# Patient Record
Sex: Male | Born: 1960 | Hispanic: Yes | Marital: Married | Smoking: Current every day smoker
Health system: Southern US, Community
[De-identification: ages and names within clinical notes are randomized; demographics above are authoritative.]

## PROBLEM LIST (undated history)

## (undated) HISTORY — PX: BACK SURGERY: SHX140

---

## 2014-08-06 ENCOUNTER — Emergency Department: Payer: Worker's Compensation

## 2014-08-06 ENCOUNTER — Emergency Department
Admission: EM | Admit: 2014-08-06 | Discharge: 2014-08-06 | Disposition: A | Discharge status: Home / Self Care | Payer: Worker's Compensation | Attending: Student | Admitting: Student

## 2014-08-06 DIAGNOSIS — Z720 Tobacco use: Secondary | ICD-10-CM | POA: Insufficient documentation

## 2014-08-06 DIAGNOSIS — S66812A Strain of other specified muscles, fascia and tendons at wrist and hand level, left hand, initial encounter: Secondary | ICD-10-CM | POA: Insufficient documentation

## 2014-08-06 DIAGNOSIS — S63522A Sprain of radiocarpal joint of left wrist, initial encounter: Secondary | ICD-10-CM | POA: Insufficient documentation

## 2014-08-06 DIAGNOSIS — Y9289 Other specified places as the place of occurrence of the external cause: Secondary | ICD-10-CM | POA: Insufficient documentation

## 2014-08-06 DIAGNOSIS — Y998 Other external cause status: Secondary | ICD-10-CM | POA: Insufficient documentation

## 2014-08-06 DIAGNOSIS — Y9389 Activity, other specified: Secondary | ICD-10-CM | POA: Insufficient documentation

## 2014-08-06 DIAGNOSIS — W208XXA Other cause of strike by thrown, projected or falling object, initial encounter: Secondary | ICD-10-CM | POA: Insufficient documentation

## 2014-08-06 MED ORDER — NAPROXEN 500 MG PO TABS: 500.0000 mg | ORAL_TABLET | Freq: Two times a day (BID) | ORAL | Status: AC

## 2014-08-06 NOTE — ED Provider Notes (Signed)
[facility] Emergency Department Provider Note  ____________________________________________  Time seen: Approximately 11:24 AM  I have reviewed the triage vital signs and the nursing notes.   HISTORY  Chief Complaint Wrist Pain   Spanish interpreter HPI Randy Brown is a 54 y.o. male patient complaining of 2 months of left wrist pain secondary to a sprain. She was seen in urgent care clinic and negative x-rays. This is a worker's comp injury which has been denied for further care. Patient is requesting a CT scan of his wrist. Patient also requesting pain medication for this complaint. Patient say he was taking tramadol prescribed for urgent care wouldn't call some itching so he stopped taking it.Patient rated his pain 7/10 described as dull and aching. Patient is continue to wear a wrist support since date of injury. Patient states his lawyer symptom to the ER.   History reviewed. No pertinent past medical history.  There are no active problems to display for this patient.   Past Surgical History  Procedure Laterality Date  . Back surgery      Current Outpatient Rx  Name  Route  Sig  Dispense  Refill  . naproxen (NAPROSYN) 500 MG tablet   Oral   Take 1 tablet (500 mg total) by mouth 2 (two) times daily with a meal.   20 tablet   0     Allergies Tramadol  No family history on file.  Social History History  Substance Use Topics  . Smoking status: Current Every Day Smoker  . Smokeless tobacco: Never Used  . Alcohol Use: No    Review of Systems Constitutional: No fever/chills Eyes: No visual changes. ENT: No sore throat. Cardiovascular: Denies chest pain. Respiratory: Denies shortness of breath. Gastrointestinal: No abdominal pain.  No nausea, no vomiting.  No diarrhea.  No constipation. Genitourinary: Negative for dysuria. Musculoskeletal: Positive for left wrist pain. Skin: Negative for rash. Neurological: Negative for  headaches, focal weakness or numbness. 10-point ROS otherwise negative.  ____________________________________________   PHYSICAL EXAM:  VITAL SIGNS: ED Triage Vitals  Enc Vitals Group     BP --      Pulse --      Resp --      Temp --      Temp src --      SpO2 --      Weight --      Height --      Head Cir --      Peak Flow --      Pain Score --      Pain Loc --      Pain Edu? --      Excl. in GC? --     Constitutional: Alert and oriented. Well appearing and in no acute distress. Eyes: Conjunctivae are normal. PERRL. EOMI. Head: Atraumatic. Nose: No congestion/rhinnorhea. Mouth/Throat: Mucous membranes are moist.  Oropharynx non-erythematous. Neck: No stridor.  No cervical spine tenderness to palpation. Hematological/Lymphatic/Immunilogical: No cervical lymphadenopathy. Cardiovascular: Normal rate, regular rhythm. Grossly normal heart sounds.  Good peripheral circulation. Respiratory: Normal respiratory effort.  No retractions. Lungs CTAB. Gastrointestinal: Soft and nontender. No distention. No abdominal bruits. No CVA tenderness. Musculoskeletal: Left wrist shows no deformity edema or ecchymosis. Mild guarding with palpation of the distal radius. Neurovascular intact.  Neurologic:  Normal speech and language. No gross focal neurologic deficits are appreciated. No gait instability. Skin:  Skin is warm, dry and intact. No rash noted. Psychiatric: Mood and affect are normal. Speech and behavior  are normal.  ____________________________________________   LABS (all labs ordered are listed, but only abnormal results are displayed)  Labs Reviewed - No data to display ____________________________________________  EKG   ____________________________________________  RADIOLOGY  No acute findings there is a widened joint space tenderness And 11 a suspicion for ligamentous injury. I, Joni Reining, personally viewed and evaluated these images as part of my medical  decision making.   ____________________________________________   PROCEDURES  Procedure(s) performed: None  Critical Care performed: No  ____________________________________________   INITIAL IMPRESSION / ASSESSMENT AND PLAN / ED COURSE  Pertinent labs & imaging results that were available during my care of the patient were reviewed by me and considered in my medical decision making (see chart for details).  Sprain left wrist. X-ray findings were discussed with patient. Advised follow orthopedics for definitive evaluation  and treatment. ____Patient advised to continue wearing a splint to evaluated by orthopedics. Patient can a prescription for naproxen. ________________________________________   FINAL CLINICAL IMPRESSION(S) / ED DIAGNOSES  Final diagnoses:  Sprain and strain of radiocarpal (joint) (ligament) of wrist, left, initial encounter       Joni Reining, PA-C 08/06/14 1259  Gayla Doss, MD 08/06/14 [phone]

## 2014-08-06 NOTE — Discharge Instructions (Signed)
Esguince de ligamento °(Ligament Sprain) °Un esguince de ligamento se produce cuando se estiran las bandas de tejido que mantienen unidos a los huesos (ligamento). °CUIDADOS EN EL HOGAR  °· Mantenga la zona afectada en reposo. °· Comience a usar la articulación cuando su médico se lo indique. °· Mantenga el área lesionada levantada (elevada) por encima del nivel del corazón. Esto podría aliviar la hinchazón (inflamación). °· Aplique hielo sobre la zona lesionada. °¨ Ponga el hielo en una bolsa plástica. °¨ Colóquese una toalla entre la piel y la bolsa de hielo. °¨ Deje el hielo durante 15 a 20 minutos, 3 a 4 veces por día. °· Use una férula, un yeso o una venda elástica según las indicaciones de su médico. °· Solo tome los medicamentos que le haya indicado su médico. °· Utilice las muletas como le haya indicado el médico. No cargue peso sobre la articulación hasta que el médico lo autorice. °SOLICITE AYUDA DE INMEDIATO SI:  °· Tiene más moretones, hinchazón o dolor. °· La lesión es en la pierna y tiene los dedos de los pies fríos, azules, con hormigueo o adormecidos. °· La lesión es en el brazo y tiene los dedos de las manos fríos, azules, con hormigueo o adormecidos. °· Siente un dolor intenso que no se alivia los medicamentos. °· El dolor empeora. °ASEGÚRESE DE QUE:  °· Comprende estas instrucciones. °· Controlará la enfermedad. °· Recibirá ayuda de inmediato si no mejora o si empeora. °Document Released: 10/12/2012 °ExitCare® Patient Information ©2015 [employer]. This information is not intended to replace advice given to you by your health care provider. Make sure you discuss any questions you have with your health care provider. ° °

## 2014-08-06 NOTE — ED Notes (Signed)
Iron bar fell on his left hand between thumb and index finger in May. Still having pain.

## 2014-08-06 NOTE — ED Notes (Signed)
Interpreter here with PA to speak with patient about results and further treatment with ortho.

## 2014-09-03 ENCOUNTER — Encounter: Payer: Self-pay | Admitting: Emergency Medicine

## 2014-09-03 ENCOUNTER — Emergency Department: Payer: Self-pay

## 2014-09-03 ENCOUNTER — Emergency Department
Admission: EM | Admit: 2014-09-03 | Discharge: 2014-09-03 | Disposition: A | Discharge status: Home / Self Care | Payer: Self-pay | Attending: Emergency Medicine | Admitting: Emergency Medicine

## 2014-09-03 DIAGNOSIS — Z791 Long term (current) use of non-steroidal anti-inflammatories (NSAID): Secondary | ICD-10-CM | POA: Insufficient documentation

## 2014-09-03 DIAGNOSIS — T1490XA Injury, unspecified, initial encounter: Secondary | ICD-10-CM

## 2014-09-03 DIAGNOSIS — S6992XA Unspecified injury of left wrist, hand and finger(s), initial encounter: Secondary | ICD-10-CM | POA: Insufficient documentation

## 2014-09-03 DIAGNOSIS — Z720 Tobacco use: Secondary | ICD-10-CM | POA: Insufficient documentation

## 2014-09-03 DIAGNOSIS — Y9289 Other specified places as the place of occurrence of the external cause: Secondary | ICD-10-CM | POA: Insufficient documentation

## 2014-09-03 DIAGNOSIS — S63512A Sprain of carpal joint of left wrist, initial encounter: Secondary | ICD-10-CM | POA: Insufficient documentation

## 2014-09-03 DIAGNOSIS — Y9389 Activity, other specified: Secondary | ICD-10-CM | POA: Insufficient documentation

## 2014-09-03 DIAGNOSIS — M25332 Other instability, left wrist: Secondary | ICD-10-CM

## 2014-09-03 DIAGNOSIS — X58XXXA Exposure to other specified factors, initial encounter: Secondary | ICD-10-CM | POA: Insufficient documentation

## 2014-09-03 DIAGNOSIS — Y990 Civilian activity done for income or pay: Secondary | ICD-10-CM | POA: Insufficient documentation

## 2014-09-03 MED ORDER — INDOMETHACIN 50 MG PO CAPS: 50.0000 mg | ORAL_CAPSULE | Freq: Two times a day (BID) | ORAL | Status: AC

## 2014-09-03 MED ORDER — KETOROLAC TROMETHAMINE 60 MG/2ML IM SOLN
60.0000 mg | Freq: Once | INTRAMUSCULAR | Status: AC
  Administered 2014-09-03: 60 mg via INTRAMUSCULAR
  Filled 2014-09-03: qty 2

## 2014-09-03 NOTE — ED Provider Notes (Signed)
[facility] Emergency Department Provider Note  ____________________________________________  Time seen: Approximately 11:43 AM  I have reviewed the triage vital signs and the nursing notes. History and physical exam obtained via interpreter [place] through interpreter services at [facility].   HISTORY  Chief Complaint Arm Pain    HPI Randy Brown is a 54 y.o. male patient states that he was injured on May 20 at work has been seen several times with x-rays been negative. Complains of continuous left wrist pain and unable to work. Positive tingling down his fingers.Patient was diagnosed with a sprain has been in urgent care clinic negative x-rays and a negative x-rays is here as well. He continues to wear a support brace since that time.   History reviewed. No pertinent past medical history.  There are no active problems to display for this patient.   Past Surgical History  Procedure Laterality Date  . Back surgery      Current Outpatient Rx  Name  Route  Sig  Dispense  Refill  . indomethacin (INDOCIN) 50 MG capsule   Oral   Take 1 capsule (50 mg total) by mouth 2 (two) times daily with a meal.   30 capsule   0   . naproxen (NAPROSYN) 500 MG tablet   Oral   Take 1 tablet (500 mg total) by mouth 2 (two) times daily with a meal.   20 tablet   0     Allergies Tramadol  No family history on file.  Social History Social History  Substance Use Topics  . Smoking status: Current Every Day Smoker  . Smokeless tobacco: Never Used  . Alcohol Use: No    Review of Systems Constitutional: No fever/chills Eyes: No visual changes. ENT: No sore throat. Cardiovascular: Denies chest pain. Respiratory: Denies shortness of breath. Gastrointestinal: No abdominal pain.  No nausea, no vomiting.  No diarrhea.  No constipation. Genitourinary: Negative for dysuria. Musculoskeletal: Positive for left wrist pain. Skin: Negative for rash. Neurological:  Negative for headaches, focal weakness or numbness.  10-point ROS otherwise negative.  ____________________________________________   PHYSICAL EXAM:  VITAL SIGNS: ED Triage Vitals  Enc Vitals Group     BP 09/03/14 1103 121/75 mmHg     Pulse Rate 09/03/14 1103 71     Resp 09/03/14 1103 16     Temp --      Temp src --      SpO2 09/03/14 1103 97 %     Weight 09/03/14 1103 145 lb (65.772 kg)     Height 09/03/14 1103 5\' 5"  (1.651 m)     Head Cir --      Peak Flow --      Pain Score 09/03/14 1103 9     Pain Loc --      Pain Edu? --      Excl. in GC? --     Constitutional: Alert and oriented. Well appearing and in no acute distress. Musculoskeletal: Left wrist shows no deformity edema or ecchymosis. Mild guarding with palpation of the distal radius. Neurovascularly intact. Increased pain with flexion extension abduction and abduction of the wrist. Neurologic:  Normal speech and language. No gross focal neurologic deficits are appreciated. No gait instability. Skin:  Skin is warm, dry and intact. No rash noted. Psychiatric: Mood and affect are normal. Speech and behavior are normal.  ____________________________________________   LABS (all labs ordered are listed, but only abnormal results are displayed)  Labs Reviewed - No data to display ____________________________________________  RADIOLOGY  IMPRESSION: 1. Deformities of the distal radius and ulnar styloid favoring old fractures. 2. Torn scapholunate ligament, with abnormally elevated scapholunate angle and abnormal widening of the scapholunate interval. Dorsal intercalated segmental instability. 3. 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 4. Ossific structure posterior to the dorsally tilted lunate, possibly an old avulsion fracture from the lunate, but well corticated currently, so likely old. A free osteochondral fragment loose within the dorsal joint is a  differential diagnostic consideration. ____________________________________________   PROCEDURES  Procedure(s) performed: None  Critical Care performed: No  ____________________________________________   INITIAL IMPRESSION / ASSESSMENT AND PLAN / ED COURSE  Pertinent labs & imaging results that were available during my care of the patient were reviewed by me and considered in my medical decision making (see chart for details).  Workmen's Comp. referral given to Dr. Cheree Ditto again in [facility]. Patient to Workmen's Comp. plan and follow-up with him. All discharge instructions were given via Spanish interpreter [place]. Patient voices no other emergency medical complaints at this time and will continue her left wrist brace for stability. ____________________________________________   FINAL CLINICAL IMPRESSION(S) / ED DIAGNOSES  Final diagnoses:  Injury  Scapho-lunate dissociation, left      Evangeline Dakin, PA-C 09/03/14 1555  Jennye Moccasin, MD 09/03/14 [phone]

## 2014-09-03 NOTE — ED Notes (Addendum)
Patient to ER for c/o increased pain to left wrist. States he was seen yesterday for same c/o and told he had problem with ligament in his arm. Patient arrives wearing wrist brace to left wrist. States prior visit was filed under worker's comp, but employer is not helping him and has a Clinical research associate involved.

## 2014-09-03 NOTE — ED Notes (Signed)
conts to have pain to left wrist area from possible injury at work on the 1st of aug. Denies new injury

## 2015-06-11 ENCOUNTER — Ambulatory Visit
Admission: RE | Admit: 2015-06-11 | Discharge: 2015-06-11 | Disposition: A | Discharge status: Home / Self Care | Payer: Disability Insurance | Source: Ambulatory Visit | Attending: Family Medicine | Admitting: Family Medicine

## 2015-06-11 ENCOUNTER — Other Ambulatory Visit: Payer: Self-pay | Admitting: Family Medicine

## 2015-06-11 DIAGNOSIS — M5134 Other intervertebral disc degeneration, thoracic region: Secondary | ICD-10-CM | POA: Diagnosis not present

## 2015-06-11 DIAGNOSIS — M2578 Osteophyte, vertebrae: Secondary | ICD-10-CM | POA: Insufficient documentation

## 2015-06-11 DIAGNOSIS — M8580 Other specified disorders of bone density and structure, unspecified site: Secondary | ICD-10-CM | POA: Diagnosis not present

## 2015-06-11 DIAGNOSIS — S3992XA Unspecified injury of lower back, initial encounter: Secondary | ICD-10-CM

## 2015-06-11 DIAGNOSIS — S299XXA Unspecified injury of thorax, initial encounter: Secondary | ICD-10-CM | POA: Diagnosis present

## 2015-06-11 DIAGNOSIS — M5030 Other cervical disc degeneration, unspecified cervical region: Secondary | ICD-10-CM | POA: Insufficient documentation

## 2015-06-11 DIAGNOSIS — Z9889 Other specified postprocedural states: Secondary | ICD-10-CM | POA: Insufficient documentation

## 2017-02-09 ENCOUNTER — Emergency Department: Payer: BLUE CROSS/BLUE SHIELD

## 2017-02-09 ENCOUNTER — Emergency Department
Admission: EM | Admit: 2017-02-09 | Discharge: 2017-02-09 | Disposition: A | Discharge status: Home / Self Care | Payer: BLUE CROSS/BLUE SHIELD | Attending: Emergency Medicine | Admitting: Emergency Medicine

## 2017-02-09 DIAGNOSIS — F17200 Nicotine dependence, unspecified, uncomplicated: Secondary | ICD-10-CM | POA: Insufficient documentation

## 2017-02-09 DIAGNOSIS — J029 Acute pharyngitis, unspecified: Secondary | ICD-10-CM | POA: Diagnosis present

## 2017-02-09 DIAGNOSIS — Z79899 Other long term (current) drug therapy: Secondary | ICD-10-CM | POA: Diagnosis not present

## 2017-02-09 MED ORDER — FEXOFENADINE-PSEUDOEPHED ER 60-120 MG PO TB12: 1.0000 | ORAL_TABLET | Freq: Two times a day (BID) | ORAL | 0 refills | Status: AC

## 2017-02-09 MED ORDER — HYDROCOD POLST-CPM POLST ER 10-8 MG/5ML PO SUER: 5.0000 mL | Freq: Every evening | ORAL | 0 refills | Status: AC | PRN

## 2017-02-09 NOTE — ED Notes (Signed)
Pt discharged to home.  Family member driving.  Discharge instructions reviewed.  Verbalized understanding.  No questions or concerns at this time.  Teach back verified.  Pt in NAD.  No items left in ED.   Interpreter present for discharge.

## 2017-02-09 NOTE — ED Triage Notes (Signed)
Interpreter present.  Pt reports sore throat and itching throat x3 days with cough.  Pt states that he vomited from coughing too much.  Pt states he felt like there was phlegm was caught and wouldn't come out so he forced himself to throw up.  Pt reports that he has been taking NyQuil OTC.  Pt is A&Ox4, in NAD.

## 2017-02-09 NOTE — ED Provider Notes (Signed)
[facility] Emergency Department Provider Note    ____________________________________________   First MD Initiated Contact with Patient 02/09/17 1410     (approximate)  I have reviewed the triage vital signs and the nursing notes.   HISTORY  Chief Complaint Sore Throat    HPI Randy Brown is a 57 y.o. male male complaining of sore and itchy throat for 3 days.  Patient states there is a copious postnasal drainage which creases at night and he feels like he is choking.  Patient states cough so much that he vomits.  States no relief taking NyQuil.  Patient unable to get a restful night secondary to coughing and drainage back of his throat.  Patient rates pain as a 5/10.  Patient described the pain is "sore".   History reviewed. No pertinent past medical history.  There are no active problems to display for this patient.   Past Surgical History:  Procedure Laterality Date  . BACK SURGERY      Prior to Admission medications   Medication Sig Start Date End Date Taking? Authorizing Provider  chlorpheniramine-HYDROcodone (TUSSIONEX PENNKINETIC ER) 10-8 MG/5ML SUER Take 5 mLs by mouth at bedtime as needed for cough. 02/09/17   Joni Reining, PA-C  fexofenadine-pseudoephedrine (ALLEGRA-D ALLERGY & CONGESTION) 60-120 MG 12 hr tablet Take 1 tablet by mouth 2 (two) times daily. 02/09/17   Joni Reining, PA-C  indomethacin (INDOCIN) 50 MG capsule Take 1 capsule (50 mg total) by mouth 2 (two) times daily with a meal. 09/03/14   Beers, Charmayne Sheer, PA-C  naproxen (NAPROSYN) 500 MG tablet Take 1 tablet (500 mg total) by mouth 2 (two) times daily with a meal. 08/06/14   Joni Reining, PA-C    Allergies Tramadol  No family history on file.  Social History Social History   Tobacco Use  . Smoking status: Current Every Day Smoker  . Smokeless tobacco: Never Used  Substance Use Topics  . Alcohol use: No  . Drug use: No    Review of  Systems Constitutional: No fever/chills Eyes: No visual changes. ENT: Postnasal drainage and sore throat cardiovascular: Denies chest pain. Respiratory: Denies shortness of breath.  Cough. Gastrointestinal: No abdominal pain.  Vomiting secondary to coughing spells, no diarrhea.  No constipation. Genitourinary: Negative for dysuria. Musculoskeletal: Negative for back pain. Skin: Negative for rash. Neurological: Negative for headaches, focal weakness or numbness. Allergic/Immunilogical: Tramadol ____________________________________________   PHYSICAL EXAM:  VITAL SIGNS: ED Triage Vitals  Enc Vitals Group     BP 02/09/17 1311 133/66     Pulse Rate 02/09/17 1311 90     Resp 02/09/17 1311 18     Temp 02/09/17 1311 98.2 F (36.8 C)     Temp Source 02/09/17 1311 Oral     SpO2 02/09/17 1311 99 %     Weight 02/09/17 1317 145 lb (65.8 kg)     Height --      Head Circumference --      Peak Flow --      Pain Score --      Pain Loc --      Pain Edu? --      Excl. in GC? --    Constitutional: Alert and oriented. Well appearing and in no acute distress. Eyes: Conjunctivae are normal. PERRL. EOMI. Head: Atraumatic. Nose: Edematous nasal turbinates mouth/Throat: Mucous membranes are moist.  Oropharynx non-erythematous.  Postnasal drainage Neck: No stridor.  No cervical spine tenderness to palpation. Hematological/Lymphatic/Immunilogical: No  cervical lymphadenopathy. Cardiovascular: Normal rate, regular rhythm. Grossly normal heart sounds.  Good peripheral circulation. Respiratory: Normal respiratory effort.  No retractions. Lungs CTAB. tenderness. Neurologic:  Normal speech and language. No gross focal neurologic deficits are appreciated. No gait instability. Skin:  Skin is warm, dry and intact. No rash noted. Psychiatric: Mood and affect are normal. Speech and behavior are normal.  ____________________________________________   LABS (all labs ordered are listed, but only abnormal  results are displayed)  Labs Reviewed - No data to display ____________________________________________  EKG   ____________________________________________  RADIOLOGY  ED MD interpretation: No acute findings on soft tissue neck x-ray.  Official radiology report(s): Dg Neck Soft Tissue  Result Date: 02/09/2017 CLINICAL DATA:  Sore throat and cough for 3 days. EXAM: NECK SOFT TISSUES - 1+ VIEW COMPARISON:  Plain film cervical spine 06/11/2015. FINDINGS: There is no evidence of retropharyngeal soft tissue swelling or epiglottic enlargement. The cervical airway is unremarkable and no radio-opaque foreign body identified. IMPRESSION: Negative exam. Electronically Signed   By: Drusilla Kannerhomas  Dalessio M.D.   On: 02/09/2017 15:10    ____________________________________________   PROCEDURES  Procedure(s) performed: None  Procedures  Critical Care performed: No  ____________________________________________   INITIAL IMPRESSION / ASSESSMENT AND PLAN / ED COURSE  As part of my medical decision making, I reviewed the following data within the electronic MEDICAL RECORD NUMBER    Pharyngitis secondary to postnasal drainage.  Discussed negative x-ray findings with patient.  Patient given discharge care instruction advised take medication as directed.  Patient advised to follow-up with the ENT clinic condition persists      ____________________________________________   FINAL CLINICAL IMPRESSION(S) / ED DIAGNOSES  Final diagnoses:  Acute pharyngitis, unspecified etiology     ED Discharge Orders        Ordered    fexofenadine-pseudoephedrine (ALLEGRA-D ALLERGY & CONGESTION) 60-120 MG 12 hr tablet  2 times daily     02/09/17 1520    chlorpheniramine-HYDROcodone (TUSSIONEX PENNKINETIC ER) 10-8 MG/5ML SUER  At bedtime PRN     02/09/17 1520       Note:  This document was prepared using Dragon voice recognition software and may include unintentional dictation errors.    Joni ReiningSmith, Ronald  K, PA-C 02/09/17 1530    Emily FilbertWilliams, Jonathan E, MD 02/10/17 [phone]

## 2017-03-10 IMAGING — CR DG WRIST COMPLETE 3+V*L*
1 series · 4 of 4 positions shown · non-contrast
Comparison: None.

CLINICAL DATA: Injury between thumb and index finger [REDACTED],
persistent pain

EXAM:
LEFT WRIST - COMPLETE 3+ VIEW

[Series 1: pa · 0.17mm/px · 4 of 4 slices shown]
[im 1/4]
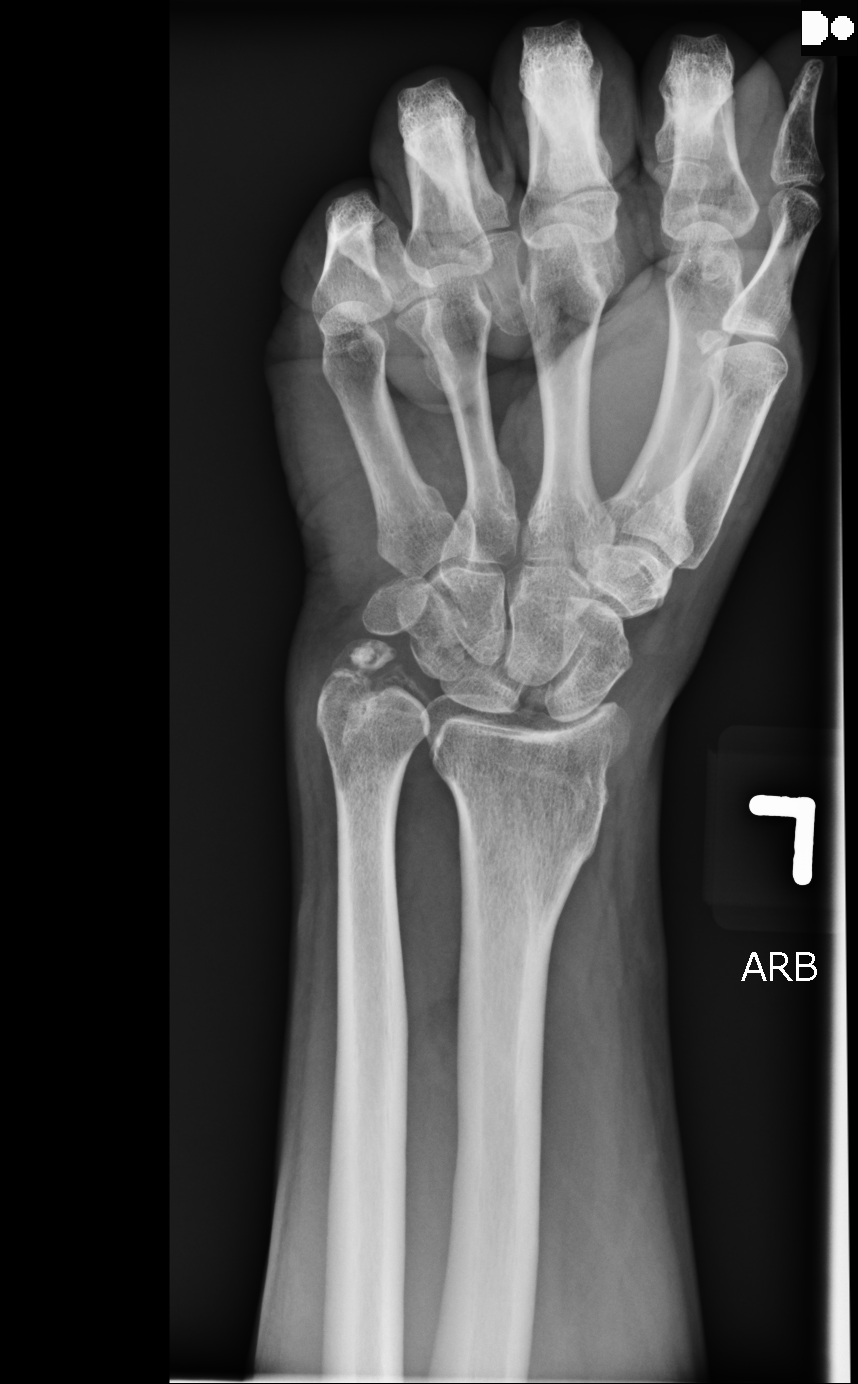
[im 2/4]
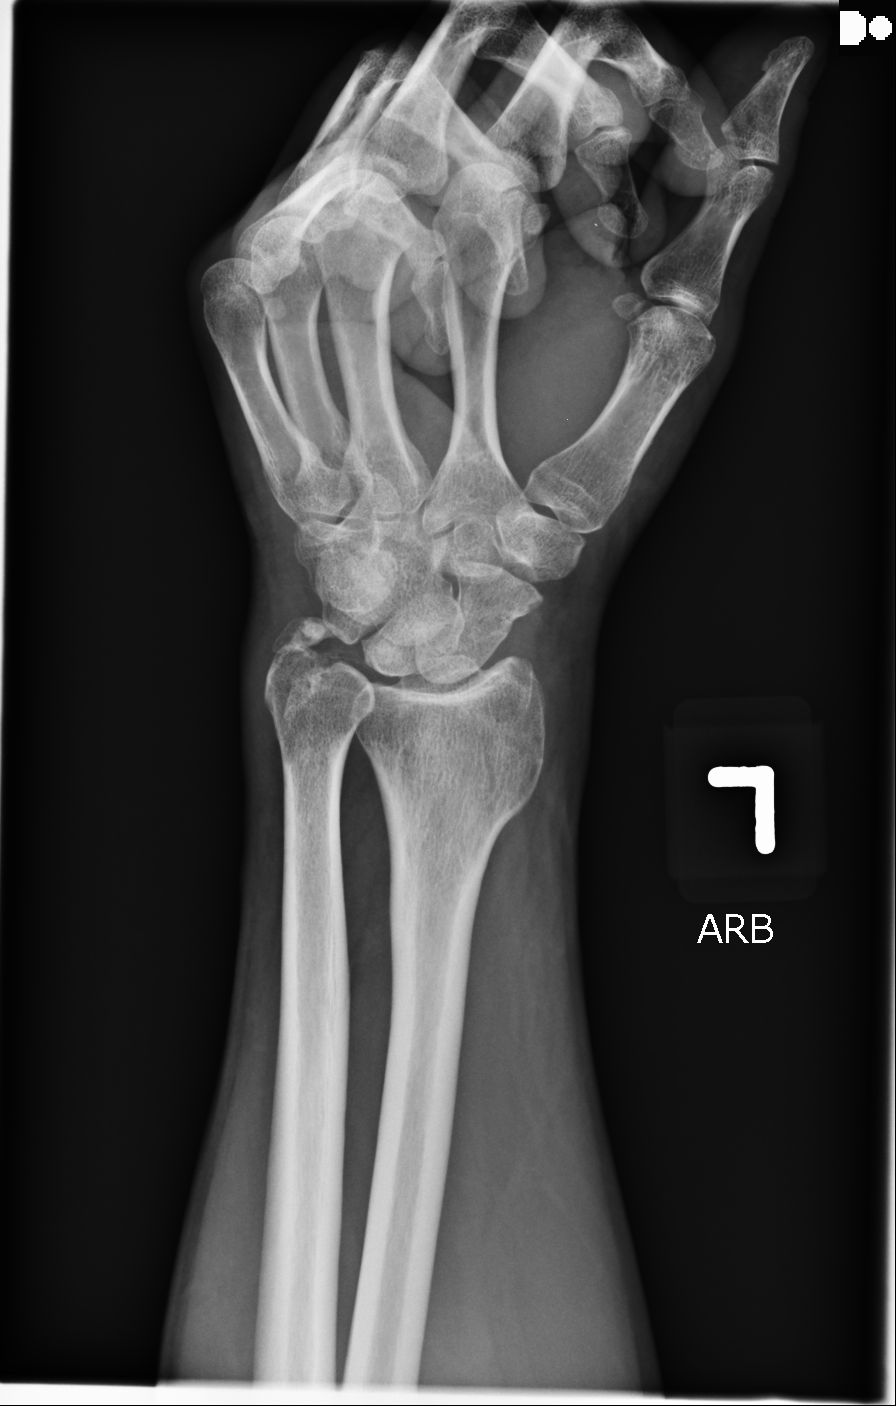
[im 3/4]
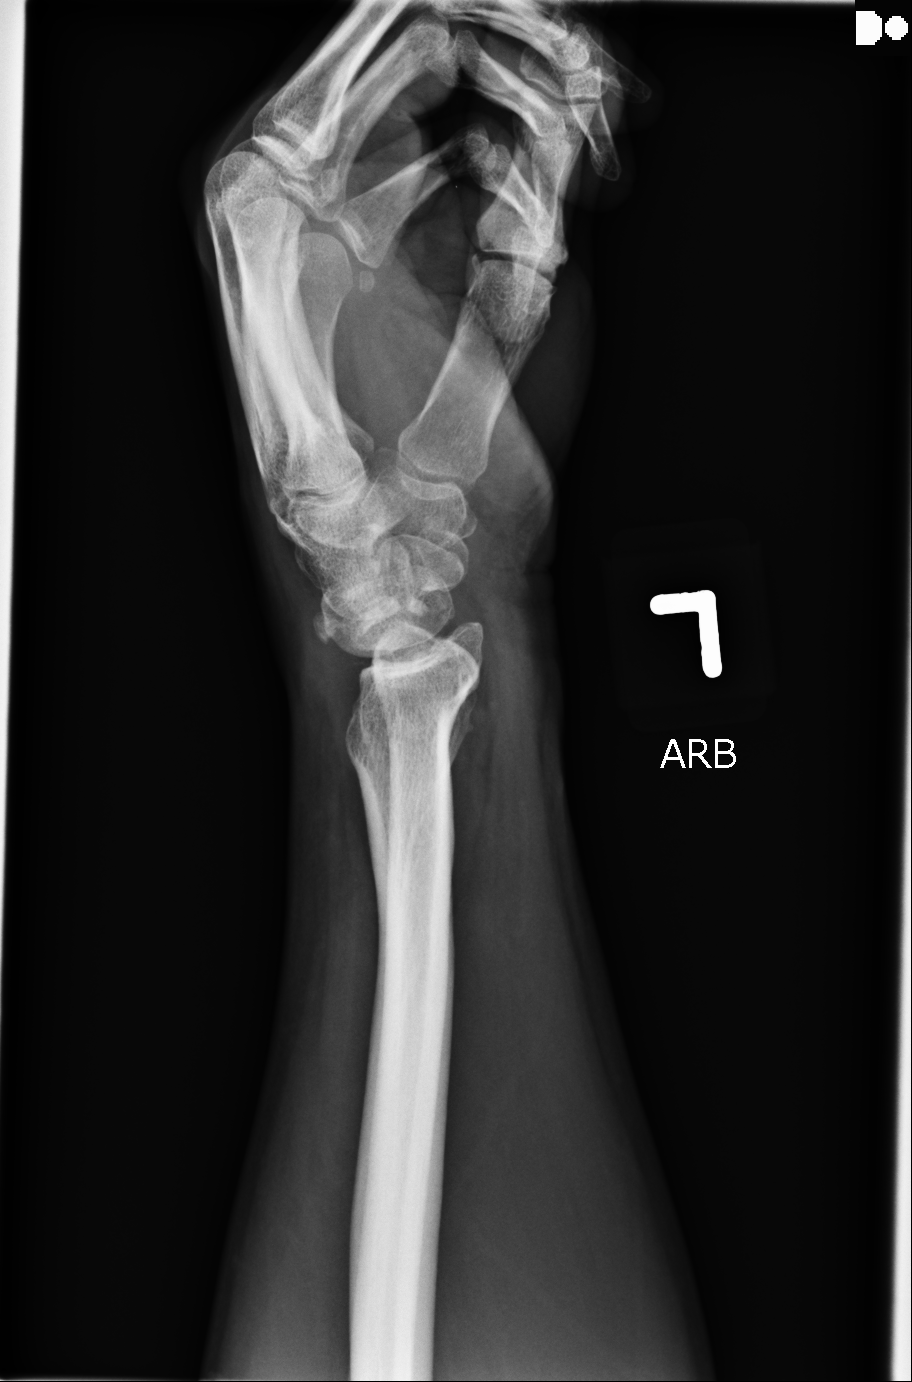
[im 4/4]
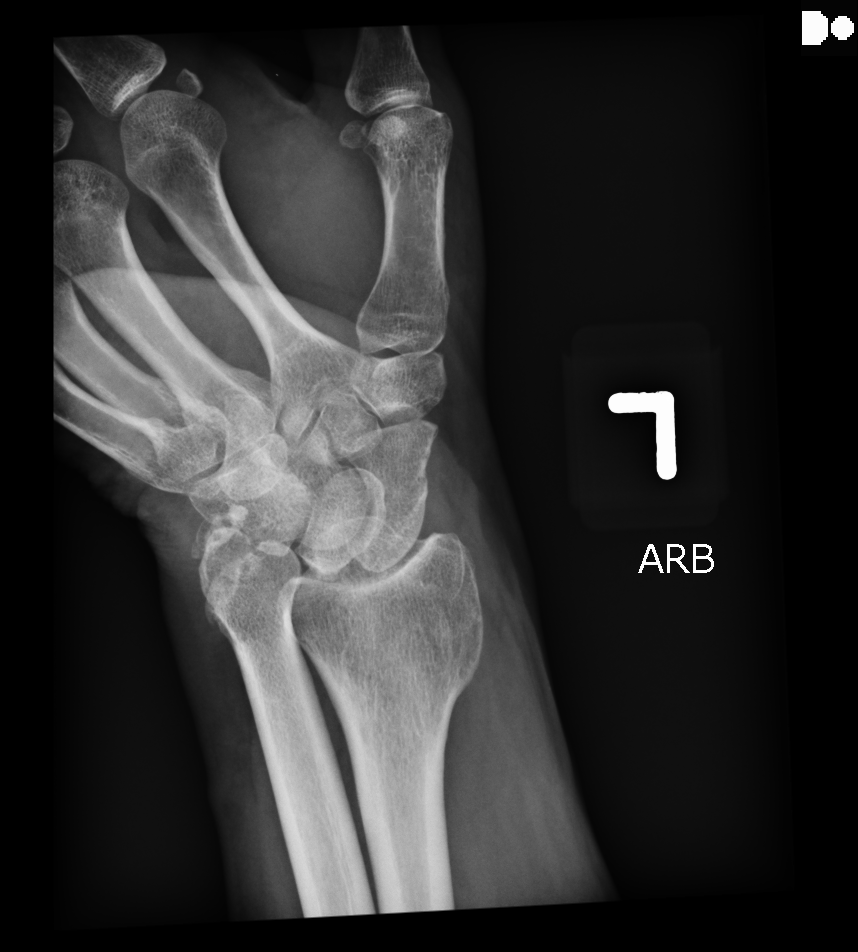

[4 of 4 positions shown; findings below may reference images not displayed]

FINDINGS: Four views of the left wrist submitted. No acute fracture or
subluxation. There is positive ulnar variance. Question healed old
fracture of the tip of ulnar styloid. There is widening of joint
space between the scaphoid and lunate suspicious for ligamental
injury or insufficiency. Narrowing of radiocarpal joint space. Mild
chondrocalcinosis.
IMPRESSION: No acute fracture or subluxation. There is positive ulnar variance.
Question healed old avulsion fracture of the tip of ulnar styloid.
There is widening of joint space between the scaphoid and lunate
suspicious for ligamental injury or insufficiency. Narrowing of
radiocarpal joint space. Mild chondrocalcinosis.

## 2019-03-25 ENCOUNTER — Ambulatory Visit: Payer: Medicare Other | Attending: Internal Medicine

## 2019-03-25 DIAGNOSIS — Z23 Encounter for immunization: Secondary | ICD-10-CM

## 2019-03-25 NOTE — Progress Notes (Signed)
   Covid-19 Vaccination Clinic  Name:  Randy Brown    MRN: [id number] DOB: 1960-10-23  03/25/2019  Mr. Randy Brown was observed post Covid-19 immunization for 15 minutes without incident. He was provided with Vaccine Information Sheet and instruction to access the V-Safe system.   Mr. Randy Brown was instructed to call 911 with any severe reactions post vaccine: Marland Kitchen Difficulty breathing  . Swelling of face and throat  . A fast heartbeat  . A bad rash all over body  . Dizziness and weakness   Immunizations Administered    Name Date Dose VIS Date Route   Pfizer COVID-19 Vaccine 03/25/2019  4:04 PM 0.3 mL 12/16/2018 Intramuscular   Manufacturer: [employer], [employer]   Lot: [id number]   NDC: [address]-8

## 2019-04-15 ENCOUNTER — Ambulatory Visit: Payer: Medicare Other | Attending: Internal Medicine

## 2019-04-15 DIAGNOSIS — Z23 Encounter for immunization: Secondary | ICD-10-CM

## 2019-04-15 NOTE — Progress Notes (Signed)
   Covid-19 Vaccination Clinic  Name:  Randy Brown    MRN: [id number] DOB: 1960/05/11  04/15/2019  Mr. Randy Brown was observed post Covid-19 immunization for 15 minutes   without incident. He was provided with Vaccine Information Sheet and instruction to access the V-Safe system.   Mr. Randy Brown was instructed to call 911 with any severe reactions post vaccine: Marland Kitchen Difficulty breathing  . Swelling of face and throat  . A fast heartbeat  . A bad rash all over body  . Dizziness and weakness   Immunizations Administered    Name Date Dose VIS Date Route   Pfizer COVID-19 Vaccine 04/15/2019  2:45 PM 0.3 mL 12/16/2018 Intramuscular   Manufacturer: [employer], [employer]   Lot: [id number]   NDC: [address]-6
